# Patient Record
Sex: Female | Born: 1970 | Race: White | Hispanic: No | Marital: Married | Smoking: Never smoker
Health system: Southern US, Community
[De-identification: ages and names within clinical notes are randomized; demographics above are authoritative.]

## PROBLEM LIST (undated history)

## (undated) DIAGNOSIS — G8929 Other chronic pain: Secondary | ICD-10-CM

## (undated) DIAGNOSIS — R51 Headache: Secondary | ICD-10-CM

## (undated) HISTORY — DX: Other chronic pain: G89.29

## (undated) HISTORY — DX: Headache: R51

---

## 1999-02-11 ENCOUNTER — Other Ambulatory Visit: Admission: RE | Admit: 1999-02-11 | Discharge: 1999-02-11 | Payer: Self-pay | Admitting: Obstetrics and Gynecology

## 2002-07-20 ENCOUNTER — Other Ambulatory Visit: Admission: RE | Admit: 2002-07-20 | Discharge: 2002-07-20 | Payer: Self-pay | Admitting: Obstetrics and Gynecology

## 2003-02-21 ENCOUNTER — Ambulatory Visit (HOSPITAL_COMMUNITY): Admission: RE | Admit: 2003-02-21 | Discharge: 2003-02-21 | Payer: Self-pay | Admitting: Obstetrics and Gynecology

## 2003-03-28 ENCOUNTER — Inpatient Hospital Stay (HOSPITAL_COMMUNITY): Admission: AD | Admit: 2003-03-28 | Discharge: 2003-03-30 | Payer: Self-pay | Admitting: Obstetrics and Gynecology

## 2003-03-28 ENCOUNTER — Encounter: Payer: Self-pay | Admitting: Obstetrics and Gynecology

## 2003-05-15 ENCOUNTER — Inpatient Hospital Stay (HOSPITAL_COMMUNITY): Admission: AD | Admit: 2003-05-15 | Discharge: 2003-05-17 | Payer: Self-pay | Admitting: Obstetrics & Gynecology

## 2003-12-25 ENCOUNTER — Other Ambulatory Visit: Admission: RE | Admit: 2003-12-25 | Discharge: 2003-12-25 | Payer: Self-pay | Admitting: Obstetrics and Gynecology

## 2005-04-07 ENCOUNTER — Ambulatory Visit: Payer: Self-pay | Admitting: Family Medicine

## 2005-06-15 ENCOUNTER — Other Ambulatory Visit: Admission: RE | Admit: 2005-06-15 | Discharge: 2005-06-15 | Payer: Self-pay | Admitting: Obstetrics and Gynecology

## 2006-09-28 ENCOUNTER — Ambulatory Visit (HOSPITAL_COMMUNITY): Admission: RE | Admit: 2006-09-28 | Discharge: 2006-09-28 | Payer: Self-pay | Admitting: Obstetrics and Gynecology

## 2006-12-20 ENCOUNTER — Inpatient Hospital Stay (HOSPITAL_COMMUNITY): Admission: AD | Admit: 2006-12-20 | Discharge: 2006-12-22 | Payer: Self-pay | Admitting: Obstetrics and Gynecology

## 2011-08-25 ENCOUNTER — Other Ambulatory Visit: Payer: Self-pay | Admitting: Obstetrics and Gynecology

## 2011-08-25 DIAGNOSIS — N63 Unspecified lump in unspecified breast: Secondary | ICD-10-CM

## 2011-09-04 ENCOUNTER — Other Ambulatory Visit: Payer: Self-pay

## 2011-09-10 ENCOUNTER — Ambulatory Visit
Admission: RE | Admit: 2011-09-10 | Discharge: 2011-09-10 | Disposition: A | Discharge status: Home / Self Care | Payer: BC Managed Care – PPO | Source: Ambulatory Visit | Attending: Obstetrics and Gynecology | Admitting: Obstetrics and Gynecology

## 2011-09-10 DIAGNOSIS — N63 Unspecified lump in unspecified breast: Secondary | ICD-10-CM

## 2016-07-15 LAB — HM PAP SMEAR

## 2016-07-21 ENCOUNTER — Other Ambulatory Visit: Payer: Self-pay | Admitting: Obstetrics and Gynecology

## 2016-07-21 DIAGNOSIS — R928 Other abnormal and inconclusive findings on diagnostic imaging of breast: Secondary | ICD-10-CM

## 2016-07-23 ENCOUNTER — Other Ambulatory Visit: Payer: Self-pay

## 2016-07-29 ENCOUNTER — Ambulatory Visit
Admission: RE | Admit: 2016-07-29 | Discharge: 2016-07-29 | Disposition: A | Discharge status: Home / Self Care | Payer: Managed Care, Other (non HMO) | Source: Ambulatory Visit | Attending: Obstetrics and Gynecology | Admitting: Obstetrics and Gynecology

## 2016-07-29 DIAGNOSIS — R928 Other abnormal and inconclusive findings on diagnostic imaging of breast: Secondary | ICD-10-CM

## 2016-07-29 LAB — HM MAMMOGRAPHY

## 2017-09-23 LAB — HEPATIC FUNCTION PANEL
ALT: 8 (ref 7–35)
AST: 11 — AB (ref 13–35)
Alkaline Phosphatase: 60 (ref 25–125)
Bilirubin, Total: 0.3

## 2017-09-23 LAB — CBC AND DIFFERENTIAL
HCT: 42 (ref 36–46)
Hemoglobin: 13.4 (ref 12.0–16.0)
NEUTROS ABS: 6
Platelets: 271 (ref 150–399)
WBC: 8.2

## 2017-09-23 LAB — BASIC METABOLIC PANEL
BUN: 13 (ref 4–21)
CREATININE: 0.6 (ref 0.5–1.1)
Glucose: 84
POTASSIUM: 4.1 (ref 3.4–5.3)
Sodium: 138 (ref 137–147)

## 2017-09-23 LAB — TSH: TSH: 2.28 (ref 0.41–5.90)

## 2017-09-27 ENCOUNTER — Encounter: Payer: Self-pay | Admitting: Family Medicine

## 2017-09-27 ENCOUNTER — Ambulatory Visit (INDEPENDENT_AMBULATORY_CARE_PROVIDER_SITE_OTHER): Payer: Managed Care, Other (non HMO) | Admitting: Family Medicine

## 2017-09-27 ENCOUNTER — Ambulatory Visit: Payer: Self-pay

## 2017-09-27 ENCOUNTER — Ambulatory Visit (INDEPENDENT_AMBULATORY_CARE_PROVIDER_SITE_OTHER): Payer: Managed Care, Other (non HMO)

## 2017-09-27 VITALS — BP 120/88 | HR 80 | Ht 65.0 in | Wt 162.4 lb

## 2017-09-27 DIAGNOSIS — M79644 Pain in right finger(s): Secondary | ICD-10-CM | POA: Diagnosis not present

## 2017-09-27 DIAGNOSIS — M1811 Unilateral primary osteoarthritis of first carpometacarpal joint, right hand: Secondary | ICD-10-CM | POA: Diagnosis not present

## 2017-09-27 NOTE — Patient Instructions (Signed)

## 2017-09-27 NOTE — Assessment & Plan Note (Signed)
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  Intra-articular injection performed today that provided significant improvement.  I will plan to check in with her in 2 weeks and see how she is progressing at that time.  We will plan to discuss other muscular skeletal complaints at that time including bilateral knees.  May need plain film x-rays at that time of the knees.  Consider further workup for hypermobility syndrome.

## 2017-09-27 NOTE — Procedures (Signed)
PROCEDURE NOTE -  ULTRASOUND GUIDEDINJECTION: Right thumb CMC injection Images were obtained and interpreted by myself, Gaspar BiddingMichael Rigby, DO  Images have been saved and stored to PACS system. Images obtained on: GE S7 Ultrasound machine  ULTRASOUND FINDINGS: Fragmentation of the [facility] consistent with OA with some soft tissue swelling.  Small joint effusion.  DESCRIPTION OF PROCEDURE:  The patient's clinical condition is marked by substantial pain and/or significant functional disability. Other conservative therapy has not provided relief, is contraindicated, or not appropriate. There is a reasonable likelihood that injection will significantly improve the patient's pain and/or functional impairment. After discussing the risks, benefits and expected outcomes of the injection and all questions were reviewed and answered, the patient wished to undergo the above named procedure. Verbal consent was obtained. The ultrasound was used to identify the target structure and adjacent neurovascular structures. The skin was then prepped in sterile fashion and the target structure was injected under direct visualization using sterile technique as below: PREP: Alcohol, Ethel Chloride APPROACH: Direct inplane, single injection, 25g 1.5" needle INJECTATE: 0.5cc 1% lidocaine, 0.5cc 0.5% marcaine, 0.5cc 40mg  DepoMedrol ASPIRATE: N/A DRESSING: Band-Aid  Post procedural instructions including recommending icing and warning signs for infection were reviewed. This procedure was well tolerated and there were no complications.   IMPRESSION: Succesful US Guided Injection

## 2017-09-27 NOTE — Progress Notes (Signed)
Subjective:  Stephanie Phelps is a 46 y.o. female who presents today with a chief complaint of right thumb pain.   HPI:  Right Thumb Pain, Acute Issue Started this morning. Stable over that time. Patient shook a piece of hair off her hand and felt a pop. No trauma or collision. Also with some swelling to the area. Took 2 ibuprofen which did not significantly help.  She has also used ice which has helped reduce some of the swelling.  She dislocated her knee in the past.  She also has some arthritis in her left thumb.  ROS: Per HPI, otherwise a 14 point review of systems was performed and was negative  PMH:  The following were reviewed and entered/updated in epic: Past Medical History:  Diagnosis Date  . Chronic headaches    Patient Active Problem List   Diagnosis Date Noted  . Primary osteoarthritis of first carpometacarpal joint of right hand 09/27/2017   History reviewed. No pertinent surgical history.  Family History  Problem Relation Age of Onset  . Allergies Mother   . Fibroids Mother   . Hearing loss Father   . Hypertension Father   . Diabetes Maternal Grandmother   . Birth defects Paternal Grandmother        colon cancer  . Hearing loss Paternal Grandfather   . Stroke Paternal Grandfather     Medications- reviewed and updated Current Outpatient Prescriptions  Medication Sig Dispense Refill  . Ascorbic Acid CRYS Take by mouth.    Marland Kitchen ibuprofen (ADVIL,MOTRIN) 200 MG tablet Take 200 mg by mouth every 6 (six) hours as needed.    Marland Kitchen levonorgestrel (MIRENA) 20 MCG/24HR IUD by Intrauterine route.    . Multiple Vitamin (MULTIVITAMIN) tablet Take 1 tablet by mouth daily.     No current facility-administered medications for this visit.     Allergies-reviewed and updated No Known Allergies  Social History   Social History  . Marital status: Married    Spouse name: N/A  . Number of children: N/A  . Years of education: N/A   Social History Main Topics  . Smoking  status: Never Smoker  . Smokeless tobacco: Never Used  . Alcohol use Yes  . Drug use: No  . Sexual activity: Yes   Other Topics Concern  . None   Social History Narrative  . None     Objective:  Physical Exam: BP 120/88   Pulse 80   Ht 5\' 5"  (1.651 m)   Wt 162 lb 6.4 oz (73.7 kg)   LMP 09/13/2017   SpO2 99%   BMI 27.02 kg/m   Gen: NAD, resting comfortably CV: RRR with no murmurs appreciated Pulm: NWOB, CTAB with no crackles, wheezes, or rhonchi GI: Normal bowel sounds present. Soft, Nontender, Nondistended. MSK:  -Right hand: No gross deformities. Tender over CMC joint.  Right thumb with limited range of motion at [facility] joint. She is able to make a fist and is able to oppose. No ecchymoses.  Cap refill normal.  Neurovascularly intact distally. Skin: Warm, dry Neuro: Grossly normal, moves all extremities Psych: Normal affect and thought content  Imaging Dg Hand Complete Right  Result Date: 09/27/2017 CLINICAL DATA:  Right hand pain, no known injury, initial encounter EXAM: RIGHT HAND - COMPLETE 3+ VIEW COMPARISON:  None. FINDINGS: No acute fracture or dislocation is noted. Mild subluxation at the first [facility] joint is noted. Cystic degeneration in the scaphoid is seen. No other bony abnormality is noted. IMPRESSION: Mild degenerative  change without acute abnormality. Electronically Signed   By: Alcide CleverMark  Lukens M.D.   On: 09/27/2017 15:14   [place] Limited Joint Space Structures Up Right(no Linked Charges)  Result Date: 09/27/2017 Andrena MewsRigby, Michael D, DO     09/27/2017  2:11 PM PROCEDURE NOTE -  ULTRASOUND GUIDEDINJECTION: Right thumb CMC injection Images were obtained and interpreted by myself, Gaspar BiddingMichael Rigby, DO Images have been saved and stored to PACS system. Images obtained on: GE S7 Ultrasound machine  ULTRASOUND FINDINGS: Fragmentation of the [facility] consistent with OA with some soft tissue swelling.  Small joint effusion. DESCRIPTION OF PROCEDURE: The patient's clinical condition  is marked by substantial pain and/or significant functional disability. Other conservative therapy has not provided relief, is contraindicated, or not appropriate. There is a reasonable likelihood that injection will significantly improve the patient's pain and/or functional impairment. After discussing the risks, benefits and expected outcomes of the injection and all questions were reviewed and answered, the patient wished to undergo the above named procedure. Verbal consent was obtained. The ultrasound was used to identify the target structure and adjacent neurovascular structures. The skin was then prepped in sterile fashion and the target structure was injected under direct visualization using sterile technique as below: PREP: Alcohol, Ethel Chloride APPROACH: Direct inplane, single injection, 25g 1.5" needle INJECTATE: 0.5cc 1% lidocaine, 0.5cc 0.5% marcaine, 0.5cc 40mg  DepoMedrol ASPIRATE: N/A DRESSING: Band-Aid Post procedural instructions including recommending icing and warning signs for infection were reviewed. This procedure was well tolerated and there were no complications.  IMPRESSION: Succesful US Guided Injection     Assessment/Plan:  Right Thumb Pain Likely secondary to mild subluxation and degenerative changes.  She does have a history of knee dislocation and may have hypermobility syndrome.  She also has likely underlying CMC osteoarthritis which could have contributed to her subluxation.  No signs of fracture or complete dislocation on her plain film.  Discussed with sports medicine physician, Dr. Arlyss Queenigby-he will be performing ultrasound-guided procedure.  Please see his separate procedure documentation.  Katina Degreealeb M. Jimmey RalphParker, MD 09/27/2017 4:00 PM

## 2017-09-28 ENCOUNTER — Telehealth: Payer: Self-pay | Admitting: Sports Medicine

## 2017-09-28 NOTE — Telephone Encounter (Signed)
Patient called in reference to cortisone shot received 09/27/17. Patient stated 2 hours after shot, pain started coming back. Patient would like to speak with a nurse about this and see if this is normal considering this was her first cortisone shot. Please call patient and advise. Ok to leave message.

## 2017-09-29 NOTE — Telephone Encounter (Signed)
Pt returned call and was advised that numbing medicine was injected along with the steroid so once that wears off the pain can return, typically within the first 24-48 hours. The pain should gradually continue to improve over the next 2 weeks. Pt verbalized understanding.

## 2017-09-29 NOTE — Telephone Encounter (Signed)
error 

## 2017-09-29 NOTE — Telephone Encounter (Signed)
Called pt and left VM to call the office.  

## 2017-10-07 ENCOUNTER — Telehealth: Payer: Self-pay

## 2017-10-07 NOTE — Telephone Encounter (Signed)
[facility]

## 2017-10-11 ENCOUNTER — Encounter: Payer: Self-pay | Admitting: Sports Medicine

## 2017-10-11 ENCOUNTER — Ambulatory Visit: Payer: Managed Care, Other (non HMO) | Admitting: Sports Medicine

## 2017-10-11 VITALS — BP 130/84 | HR 82 | Ht 65.0 in | Wt 161.4 lb

## 2017-10-11 DIAGNOSIS — M222X2 Patellofemoral disorders, left knee: Secondary | ICD-10-CM | POA: Diagnosis not present

## 2017-10-11 DIAGNOSIS — I8393 Asymptomatic varicose veins of bilateral lower extremities: Secondary | ICD-10-CM | POA: Insufficient documentation

## 2017-10-11 DIAGNOSIS — M222X1 Patellofemoral disorders, right knee: Secondary | ICD-10-CM

## 2017-10-11 DIAGNOSIS — M1811 Unilateral primary osteoarthritis of first carpometacarpal joint, right hand: Secondary | ICD-10-CM | POA: Diagnosis not present

## 2017-10-11 MED ORDER — DICLOFENAC SODIUM 2 % TD SOLN: 1.0000 "application " | Freq: Two times a day (BID) | TRANSDERMAL | 2 refills | Status: AC

## 2017-10-11 MED ORDER — DICLOFENAC SODIUM 2 % TD SOLN: 1.0000 "application " | Freq: Two times a day (BID) | TRANSDERMAL | 0 refills | Status: AC

## 2017-10-11 NOTE — Assessment & Plan Note (Signed)
2 out of 10 discomfort at this time.  We will have her continue with topical Pennsaid for the next week then transition to as needed.  Suspect this will only continue to improve.  Can consider repeat injections.  She has mild to moderate OA.  Suspect acute subluxation with likely irritation of a bone spur that resulted in her prior symptoms.  If recurrent can consider further advanced imaging.

## 2017-10-11 NOTE — Progress Notes (Signed)
OFFICE VISIT NOTE Stephanie FellsMichael D. Delorise Shinerigby, Phelps  [facility] [phone]  Stephanie Phelps - 46 y.o. female MRN [id number]  Date of birth: 05/28/1971  Visit Date: 10/11/2017  PCP: Stephanie Phelps, Stephanie M, MD   Referred by: Stephanie Phelps, Stephanie M, MD  Stephanie Phelps, CMA acting as scribe for Dr. Berline Phelps.  SUBJECTIVE:   Chief Complaint  Patient presents with  . Follow-up    RT hand pain   HPI: As below and per problem based documentation when appropriate.  Stephanie Phelps is presenting today in follow-up of RT hand pain. She was seen by Dr. Jimmey RalphParker 09/27/2017 and received Depo-Medrol inj.   Pt reports improvement in RT hand pain since she was last seen. She continues soreness in the joint while using the hand but its not a severe as it was prior to the injection. She has noticed that it is easier to write and work. She still has a hard time trying to open things like a water bottle. She has noticed a little swelling around the joint. She has been on the road training recently so she hasn't been doing as much physical work while at work.   Chronic bilateral knee pain with crepitation that is mild in nature.  Some grinding especially while going up and down steps.  No locking or giving way.  Ibuprofen does seem to be beneficial if she takes it when she is in pain but currently in 0 out of 10 discomfort.    Review of Systems  Constitutional: Negative for chills, fever and malaise/fatigue.  HENT: Positive for congestion and sinus pain.   Respiratory: Negative for shortness of breath and wheezing.   Cardiovascular: Negative for chest pain and palpitations.  Musculoskeletal: Positive for joint pain.  Neurological: Positive for headaches. Negative for dizziness, tingling and weakness.  Endo/Heme/Allergies: Positive for environmental allergies.    Otherwise per HPI.  HISTORY & PERTINENT PRIOR DATA:  No specialty comments available. She reports that  has never smoked. she has  never used smokeless tobacco. No results for input(s): HGBA1C, LABURIC, CREATINE in the last 8760 hours.  Invalid input(s): CR Allergies reviewed per EMR Prior to Admission medications   Medication Sig Start Date End Date Taking? Authorizing Provider  Ascorbic Acid CRYS Take by mouth.   Yes [provider]  Cholecalciferol (VITAMIN D3) 1000 units CHEW Chew by mouth.   Yes [provider]  ibuprofen (ADVIL,MOTRIN) 200 MG tablet Take 200 mg by mouth every 6 (six) hours as needed.   Yes [provider]  levonorgestrel (MIRENA) 20 MCG/24HR IUD by Intrauterine route.   Yes [provider]  Multiple Vitamin (MULTIVITAMIN) tablet Take 1 tablet by mouth daily.   Yes [provider]  Diclofenac Sodium (PENNSAID) 2 % SOLN Place 1 application 2 (two) times daily onto the skin. 10/11/17   Stephanie Mewsigby, Stephanie Phelps  Diclofenac Sodium (PENNSAID) 2 % SOLN Place 1 application 2 (two) times daily for 1 day onto the skin. 10/11/17 10/12/17  Stephanie Mewsigby, Stephanie Phelps   Patient Active Problem List   Diagnosis Date Noted  . Varicose veins of both lower extremities 10/11/2017  . Patellofemoral pain syndrome of both knees 10/11/2017  . Primary osteoarthritis of first carpometacarpal joint of right hand 09/27/2017   Past Medical History:  Diagnosis Date  . Chronic headaches    Family History  Problem Relation Age of Onset  . Allergies Mother   . Fibroids Mother   . Hearing  loss Father   . Hypertension Father   . Diabetes Maternal Grandmother   . Birth defects Paternal Grandmother        colon cancer  . Hearing loss Paternal Grandfather   . Stroke Paternal Grandfather    No past surgical history on file. Social History   Occupational History  . Not on file  Tobacco Use  . Smoking status: Never Smoker  . Smokeless tobacco: Never Used  Substance and Sexual Activity  . Alcohol use: Yes  . Drug use: No  . Sexual activity: Yes    OBJECTIVE:  VS:  HT:5\' 5"   (165.1 cm)   WT:161 lb 6.4 oz (73.2 kg)  BMI:26.86    BP:130/84  HR:82bpm  TEMP: ( )  RESP:98 % EXAM: Findings:  Right hand overall well aligned.  No significant deformity.  She has a small amount of pain over the [facility] with direct compression however no pain with axial loading or circumduction.  No crepitation appreciated.  Grip strength is intact.  Thumb abduction and opposition is intact to the index finger as well as to the fifth finger.  Bilateral knees are overall well aligned.  She has no significant mechanical symptoms with [place].  No effusion.  VMO definition is poorly defined.  She has a slight valgus deformity but this is mild. + Patellar Grind    RADIOLOGY: DG Hand Complete Right CLINICAL DATA:  Right hand pain, no known injury, initial encounter  EXAM: RIGHT HAND - COMPLETE 3+ VIEW  COMPARISON:  None.  FINDINGS: No acute fracture or dislocation is noted. Mild subluxation at the first [facility] is noted. Cystic degeneration in the scaphoid is seen. No other bony abnormality is noted.  IMPRESSION: Mild degenerative change without acute abnormality.  Electronically Signed   By: Alcide CleverMark  Lukens Phelps.D.   On: 09/27/2017 15:14 US LIMITED JOINT SPACE STRUCTURES UP RIGHT(NO LINKED CHARGES) Stephanie Phelps, Stephanie Phelps     09/27/2017  2:11 PM PROCEDURE NOTE -  ULTRASOUND GUIDEDINJECTION: Right thumb CMC  injection Images were obtained and interpreted by myself, Stephanie BiddingMichael Rigby, Phelps   Images have been saved and stored to PACS system. Images obtained on: GE S7 Ultrasound machine  ULTRASOUND FINDINGS: Fragmentation of the [facility] joint consistent  with OA with some soft tissue swelling.  Small joint effusion.  DESCRIPTION OF PROCEDURE:  The patient's clinical condition is marked by substantial pain  and/or significant functional disability. Other conservative  therapy has not provided relief, is contraindicated, or not  appropriate. There is a reasonable likelihood that  injection will  significantly improve the patient's pain and/or functional  impairment. After discussing the risks, benefits and expected  outcomes of the injection and all questions were reviewed and  answered, the patient wished to undergo the above named  procedure. Verbal consent was obtained. The ultrasound was used  to identify the target structure and adjacent neurovascular  structures. The skin was then prepped in sterile fashion and the  target structure was injected under direct visualization using  sterile technique as below: PREP: Alcohol, Ethel Chloride APPROACH: Direct inplane, single injection, 25g 1.5" needle INJECTATE: 0.5cc 1% lidocaine, 0.5cc 0.5% marcaine, 0.5cc 40mg   DepoMedrol ASPIRATE: N/A DRESSING: Band-Aid  Post procedural instructions including recommending icing and  warning signs for infection were reviewed. This procedure was  well tolerated and there were no complications.   IMPRESSION: Succesful US Guided Injection    ASSESSMENT & PLAN:     ICD-10-CM   1. Primary osteoarthritis of  first carpometacarpal joint of right hand M18.11   2. 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  No significant mechanical symptoms.  Therapeutic exercises per procedure note  Primary osteoarthritis of first carpometacarpal joint of right hand 2 out of 10 discomfort at this time.  We will have her continue with topical Pennsaid for the next week then transition to as needed.  Suspect this will only continue to improve.  Can consider repeat injections.  She has mild to moderate OA.  Suspect acute subluxation with likely irritation of a bone spur that resulted in her prior symptoms.  If recurrent can consider further advanced imaging.  PROCEDURE NOTE: THERAPEUTIC EXERCISES  ([id number]) 15 minutes spent for Therapeutic exercises as below and as referenced in the AVS. This included exercises focusing on stretching, strengthening, with significant focus on eccentric aspects.  Proper technique shown and discussed handout in great detail with ATC. All questions were discussed and answered.   Long term goals include an improvement in range of motion, strength, endurance as well as avoiding reinjury. Frequency of visits is one time as determined during today's  office visit. Frequency of exercises to be performed is as per handout.  EXERCISES REVIEWED:  VMO and Hip Abduction  ================================================================= Patient Instructions  Please perform the exercise program that we have prepared for you and gone over in detail on a daily basis.  In addition to the handout you were provided you can access your program through: [email or web address]   Your unique program code is:  [email or web address]  ================================================================= No future appointments.  Follow-up: Return if symptoms worsen or fail to improve.   CMA/ATC served as Neurosurgeon during this visit. History, Physical, and Plan performed by medical provider. Documentation and orders reviewed and attested to.      Stephanie Bidding, Phelps    [facility] Sports Medicine Physician

## 2017-10-11 NOTE — Procedures (Signed)

## 2017-10-11 NOTE — Assessment & Plan Note (Signed)
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  No significant mechanical symptoms.  Therapeutic exercises per procedure note

## 2017-10-11 NOTE — Patient Instructions (Signed)
Please perform the exercise program that we have prepared for you and gone over in detail on a daily basis.  In addition to the handout you were provided you can access your program through: [email or web address]   Your unique program code is:  [email or web address]

## 2017-12-13 ENCOUNTER — Encounter: Payer: Self-pay | Admitting: Physical Therapy

## 2018-04-21 ENCOUNTER — Telehealth: Payer: Self-pay | Admitting: Family Medicine

## 2018-04-21 NOTE — Telephone Encounter (Signed)
Called [employer], they will fax over another copy of the complete form.

## 2018-04-21 NOTE — Telephone Encounter (Signed)
Copied from CRM [id number]. Topic: Inquiry >> Apr 21, 2018 12:44 PM Maia Petties wrote: Reason for CRM: Stephanie Phelps only received partial fax with the "attending physician statement". Please refax to her at [phone]

## 2018-04-22 NOTE — Telephone Encounter (Signed)
Only one form was received from pt. Additional forms have still not been received. Called pt, unable to leave VM.

## 2018-04-26 NOTE — Telephone Encounter (Signed)
Spoke with patient and advise that forms has been faxed and that I had been contacted by [employer] about an additional form. She will get the form to [employer] and contact the office if any additional information is needed.

## 2018-05-11 NOTE — Telephone Encounter (Signed)
See note.   Copied from CRM [id number]. Topic: General - Other >> May 11, 2018  1:07 PM Cipriano BunkerLambe, Annette S wrote: Reason for CRM:    Pt is needing to come by and pick up copy of her faxed for Accident sheet under Media .  She is coming by this afternoon to pick up at 4:30

## 2018-05-12 NOTE — Telephone Encounter (Signed)
Called and left VM for pt to call the office. Form is ready for pick up.

## 2018-05-12 NOTE — Telephone Encounter (Signed)
Forwarding

## 2018-05-13 NOTE — Telephone Encounter (Signed)
Per Ellie, pt called yesterday and was advised that form is ready for pick up.

## 2019-09-17 NOTE — Progress Notes (Signed)
[facility] [place], [place] [address] Phone: [phone] Subjective:   I [name] am serving as a Education administrator for Dr. Hulan Saas.    CC: Left shoulder pain, new problem  ASN:[id number]  Stephanie Phelps is a 48 y.o. female coming in with complaint of left shoulder pain. Doesn't remember a specific injury that caused pain. Tingling radiates down the arm into the hand.   Onset- chronic (4 weeks)  Location- joint (superior)  Character- achy, pull, extension, dull Aggravating factors- ADLs Reliving factors-   Therapies tried- Ibuprofen  Severity-  4-6/10 at its worse      Past Medical History:  Diagnosis Date  . Chronic headaches    No past surgical history on file. Social History   Socioeconomic History  . Marital status: Married    Spouse name: Not on file  . Number of children: Not on file  . Years of education: Not on file  . Highest education level: Not on file  Occupational History  . Not on file  Social Needs  . Financial resource strain: Not on file  . Food insecurity    Worry: Not on file    Inability: Not on file  . Transportation needs    Medical: Not on file    Non-medical: Not on file  Tobacco Use  . Smoking status: Never Smoker  . Smokeless tobacco: Never Used  Substance and Sexual Activity  . Alcohol use: Yes  . Drug use: No  . Sexual activity: Yes  Lifestyle  . Physical activity    Days per week: Not on file    Minutes per session: Not on file  . Stress: Not on file  Relationships  . Social Herbalist on phone: Not on file    Gets together: Not on file    Attends religious service: Not on file    Active member of club or organization: Not on file    Attends meetings of clubs or organizations: Not on file    Relationship status: Not on file  Other Topics Concern  . Not on file  Social History Narrative  . Not on file   No Known Allergies Family History  Problem Relation Age of  Onset  . Allergies Mother   . Fibroids Mother   . Hearing loss Father   . Hypertension Father   . Diabetes Maternal Grandmother   . Birth defects Paternal Grandmother        colon cancer  . Hearing loss Paternal Grandfather   . Stroke Paternal Grandfather     Current Outpatient Medications (Endocrine & Metabolic):  .  levonorgestrel (MIRENA) 20 MCG/24HR IUD, by Intrauterine route.    Current Outpatient Medications (Analgesics):  .  ibuprofen (ADVIL,MOTRIN) 200 MG tablet, Take 200 mg by mouth every 6 (six) hours as needed. .  meloxicam (MOBIC) 15 MG tablet, Take 1 tablet (15 mg total) by mouth daily.   Current Outpatient Medications (Other):  Marland Kitchen  Ascorbic Acid CRYS, Take by mouth. .  Cholecalciferol (VITAMIN D3) 1000 units CHEW, Chew by mouth. .  Diclofenac Sodium (PENNSAID) 2 % SOLN, Place 1 application 2 (two) times daily onto the skin. .  Multiple Vitamin (MULTIVITAMIN) tablet, Take 1 tablet by mouth daily. Marland Kitchen  gabapentin (NEURONTIN) 100 MG capsule, Take 2 capsules (200 mg total) by mouth at bedtime. .  Vitamin D, Ergocalciferol, (DRISDOL) 1.25 MG (50000 UT) CAPS capsule, Take 1 capsule (50,000 Units total) by  mouth every 7 (seven) days.    Past medical history, social, surgical and family history all reviewed in electronic medical record.  No pertanent information unless stated regarding to the chief complaint.   Review of Systems:  No headache, visual changes, nausea, vomiting, diarrhea, constipation, dizziness, abdominal pain, skin rash, fevers, chills, night sweats, weight loss, swollen lymph nodes, body aches, joint swelling, chest pain, shortness of breath, mood changes.  Positive muscle aches  Objective  Blood pressure (!) 146/84, pulse 78, height 5\' 5"  (1.651 m), weight 174 lb (78.9 kg), SpO2 98 %.    General: No apparent distress alert and oriented x3 mood and affect normal, dressed appropriately.  HEENT: Pupils equal, extraocular movements intact  Respiratory:  Patient's speak in full sentences and does not appear short of breath  Cardiovascular: No lower extremity edema, non tender, no erythema  Skin: Warm dry intact with no signs of infection or rash on extremities or on axial skeleton.  Abdomen: Soft nontender  Neuro: Cranial nerves II through XII are intact, neurovascularly intact in all extremities with 2+ DTRs and 2+ pulses.  Lymph: No lymphadenopathy of posterior or anterior cervical chain or axillae bilaterally.  Gait normal with good balance and coordination.  MSK:  Non tender with full range of motion and good stability and symmetric strength and tone of  elbows, wrist, hip, knee and ankles bilaterally.  Shoulder: left Inspection reveals no abnormalities, atrophy or asymmetry. Palpation is normal with no tenderness over AC joint or bicipital groove. ROM is decreased in all planes of 5 to 10 degrees. Rotator cuff strength normal throughout. signs of impingement with positive Neer and Hawkin's tests, but negative empty can sign. Speeds and Yergason's tests normal. labral pathology noted with negative Obrien's, negative clunk and good stability. Normal scapular function observed. No painful arc and no drop arm sign. No apprehension sign  MSK performed of: left This study was ordered, performed, and interpreted by [place] D.O.  Shoulder:   Supraspinatus:  Appears normal on long and transverse views, Bursal bulge seen with shoulder abduction on impingement view.  Thickening of anterior capsule noted Infraspinatus:  Appears normal on long and transverse views. Significant increase in Doppler flow Subscapularis:  Appears normal on long and transverse views. Positive bursa Teres Minor:  Appears normal on long and transverse views. AC joint: Moderate arthritic changes Glenohumeral Joint:  Appears normal without effusion. Glenoid Labrum:  Intact without visualized tears. Biceps Tendon:  Appears normal on long and transverse views, no  fraying of tendon, tendon located in intertubercular groove, no subluxation with shoulder internal o r external rotation.  Impression: Subacromial bursitis with likely early frozen shoulder  Procedure: Real-time Ultrasound Guided Injection of left glenohumeral joint Device: GE Logiq E  Ultrasound guided injection is preferred based studies that show increased duration, increased effect, greater accuracy, decreased procedural pain, increased response rate with ultrasound guided versus blind injection.  Verbal informed consent obtained.  Time-out conducted.  Noted no overlying erythema, induration, or other signs of local infection.  Skin prepped in a sterile fashion.  Local anesthesia: Topical Ethyl chloride.  With sterile technique and under real time ultrasound guidance:  Joint visualized.  23g 1  inch needle inserted posterior approach. Pictures taken for needle placement. Patient did have injection of 2 cc of 1% lidocaine, 2 cc of 0.5% Marcaine, and 1.0 cc of Kenalog 40 mg/dL. Completed without difficulty  Pain immediately resolved suggesting accurate placement of the medication.  Advised to call if fevers/chills,  erythema, induration, drainage, or persistent bleeding.  Images permanently stored and available for review in the ultrasound unit.  Impression: Technically successful ultrasound guided injection.  /[id number]; 15 additional minutes spent for Therapeutic exercises as stated in above notes.  This included exercises focusing on stretching, strengthening, with significant focus on eccentric aspects.   Long term goals include an improvement in range of motion, strength, endurance as well as avoiding reinjury. Patient's frequency would include in 1-2 times a day, 3-5 times a week for a duration of 6-12 weeks. Shoulder Exercises that included:  Basic scapular stabilization to include adduction and depression of scapula Scaption, focusing on proper movement and good control Internal and  External rotation utilizing a theraband, with elbow tucked at side entire time Rows with theraband which was given today  Proper technique shown and discussed handout in great detail with ATC.  All questions were discussed and answered.      Impression and Recommendations:     This case required medical decision making of moderate complexity. The above documentation has been reviewed and is accurate and complete Stephanie SaaZachary M Song Myre, DO       Note: This dictation was prepared with Dragon dictation along with smaller phrase technology. Any transcriptional errors that result from this process are unintentional.

## 2019-09-18 ENCOUNTER — Ambulatory Visit (INDEPENDENT_AMBULATORY_CARE_PROVIDER_SITE_OTHER): Payer: Managed Care, Other (non HMO) | Admitting: Family Medicine

## 2019-09-18 ENCOUNTER — Other Ambulatory Visit: Payer: Self-pay

## 2019-09-18 ENCOUNTER — Ambulatory Visit: Payer: Self-pay

## 2019-09-18 ENCOUNTER — Encounter: Payer: Self-pay | Admitting: Family Medicine

## 2019-09-18 VITALS — BP 146/84 | HR 78 | Ht 65.0 in | Wt 174.0 lb

## 2019-09-18 DIAGNOSIS — M25512 Pain in left shoulder: Secondary | ICD-10-CM

## 2019-09-18 DIAGNOSIS — M7502 Adhesive capsulitis of left shoulder: Secondary | ICD-10-CM | POA: Diagnosis not present

## 2019-09-18 DIAGNOSIS — G8929 Other chronic pain: Secondary | ICD-10-CM | POA: Diagnosis not present

## 2019-09-18 DIAGNOSIS — M7500 Adhesive capsulitis of unspecified shoulder: Secondary | ICD-10-CM | POA: Insufficient documentation

## 2019-09-18 MED ORDER — GABAPENTIN 100 MG PO CAPS: 200.0000 mg | ORAL_CAPSULE | Freq: Every day | ORAL | 0 refills | Status: AC

## 2019-09-18 MED ORDER — MELOXICAM 15 MG PO TABS: 15.0000 mg | ORAL_TABLET | Freq: Every day | ORAL | 0 refills | Status: DC

## 2019-09-18 MED ORDER — VITAMIN D (ERGOCALCIFEROL) 1.25 MG (50000 UNIT) PO CAPS: 50000.0000 [IU] | ORAL_CAPSULE | ORAL | 0 refills | Status: AC

## 2019-09-18 NOTE — Patient Instructions (Addendum)
Once Weekly Vit D Meloxicam Exercises 3x a week Ice 20 min 2x a day Gabapentin See me again in 5-6 weeks

## 2019-09-18 NOTE — Assessment & Plan Note (Signed)
Patient given injection, tolerated the procedure well, discussed with her to see rheumatologist.  Patient is to increase activity slowly.  Discussed icing regimen.  Patient will vitamin D, meloxicam and gabapentin given.  Follow-up with me again in 6 weeks

## 2019-10-12 ENCOUNTER — Other Ambulatory Visit: Payer: Self-pay | Admitting: Family Medicine

## 2019-10-29 NOTE — Progress Notes (Signed)
[facility] [address] [place] [place], [place] [address] Phone: [phone] Subjective:   Stephanie Phelps, am serving as a scribe for Dr. Antoine Primas.  I'm seeing this patient by the request  of:  Ardith Dark, MD   This visit occurred during the SARS-CoV-2 public health emergency.  Safety protocols were in place, including screening questions prior to the visit, additional usage of staff PPE, and extensive cleaning of exam room while observing appropriate contact time as indicated for disinfecting solutions.     CC: Left shoulder pain  URK:[id number]   09/18/2019 Patient given injection, tolerated the procedure well, discussed with her to see rheumatologist.  Patient is to increase activity slowly.  Discussed icing regimen.  Patient will vitamin D, meloxicam and gabapentin given.  Follow-up with me again in 6 weeks  Update 10/30/2019 Stephanie Phelps is a 48 y.o. female coming in with complaint of left shoulder pain. Injection did help after one week. Feels that she is improving. Is able to put bra on without pain. General soreness with flexion. Has not used gabapentin but did try meloxicam and vitamin D.    Shoulder was injected September 18, 2019  Past Medical History:  Diagnosis Date  . Chronic headaches    No past surgical history on file. Social History   Socioeconomic History  . Marital status: Married    Spouse name: Not on file  . Number of children: Not on file  . Years of education: Not on file  . Highest education level: Not on file  Occupational History  . Not on file  Social Needs  . Financial resource strain: Not on file  . Food insecurity    Worry: Not on file    Inability: Not on file  . Transportation needs    Medical: Not on file    Non-medical: Not on file  Tobacco Use  . Smoking status: Never Smoker  . Smokeless tobacco: Never Used  Substance and Sexual Activity  . Alcohol use: Yes  . Drug use: No  . Sexual activity: Yes   Lifestyle  . Physical activity    Days per week: Not on file    Minutes per session: Not on file  . Stress: Not on file  Relationships  . Social Musician on phone: Not on file    Gets together: Not on file    Attends religious service: Not on file    Active member of club or organization: Not on file    Attends meetings of clubs or organizations: Not on file    Relationship status: Not on file  Other Topics Concern  . Not on file  Social History Narrative  . Not on file   No Known Allergies Family History  Problem Relation Age of Onset  . Allergies Mother   . Fibroids Mother   . Hearing loss Father   . Hypertension Father   . Diabetes Maternal Grandmother   . Birth defects Paternal Grandmother        colon cancer  . Hearing loss Paternal Grandfather   . Stroke Paternal Grandfather     Current Outpatient Medications (Endocrine & Metabolic):  .  levonorgestrel (MIRENA) 20 MCG/24HR IUD, by Intrauterine route.    Current Outpatient Medications (Analgesics):  .  ibuprofen (ADVIL,MOTRIN) 200 MG tablet, Take 200 mg by mouth every 6 (six) hours as needed. .  meloxicam (MOBIC) 15 MG tablet, TAKE 1 TABLET BY MOUTH EVERY  DAY   Current Outpatient Medications (Other):  Marland Kitchen  Ascorbic Acid CRYS, Take by mouth. .  Cholecalciferol (VITAMIN D3) 1000 units CHEW, Chew by mouth. .  Diclofenac Sodium (PENNSAID) 2 % SOLN, Place 1 application 2 (two) times daily onto the skin. Marland Kitchen  gabapentin (NEURONTIN) 100 MG capsule, Take 2 capsules (200 mg total) by mouth at bedtime. .  Multiple Vitamin (MULTIVITAMIN) tablet, Take 1 tablet by mouth daily. .  Vitamin D, Ergocalciferol, (DRISDOL) 1.25 MG (50000 UT) CAPS capsule, Take 1 capsule (50,000 Units total) by mouth every 7 (seven) days. .  Vitamin D, Ergocalciferol, (DRISDOL) 1.25 MG (50000 UT) CAPS capsule, Take 1 capsule (50,000 Units total) by mouth every 7 (seven) days.    Past medical history, social, surgical and family history  all reviewed in electronic medical record.  No pertanent information unless stated regarding to the chief complaint.   Review of Systems:  No headache, visual changes, nausea, vomiting, diarrhea, constipation, dizziness, abdominal pain, skin rash, fevers, chills, night sweats, weight loss, swollen lymph nodes, body aches, joint swelling, muscle aches, chest pain, shortness of breath, mood changes.   Objective  Blood pressure 130/70, pulse 75, height 5\' 5"  (1.651 m), weight 174 lb (78.9 kg), SpO2 98 %.   General: No apparent distress alert and oriented x3 mood and affect normal, dressed appropriately.  HEENT: Pupils equal, extraocular movements intact  Respiratory: Patient's speak in full sentences and does not appear short of breath  Cardiovascular: No lower extremity edema, non tender, no erythema  Skin: Warm dry intact with no signs of infection or rash on extremities or on axial skeleton.  Abdomen: Soft nontender  Neuro: Cranial nerves II through XII are intact, neurovascularly intact in all extremities with 2+ DTRs and 2+ pulses.  Lymph: No lymphadenopathy of posterior or anterior cervical chain or axillae bilaterally.  Gait normal with good balance and coordination.  MSK:  Non tender with full range of motion and good stability and symmetric strength and tone of elbows, wrist, hip, knee and ankles bilaterally.  Shoulder: Left Inspection reveals no abnormalities, atrophy or asymmetry. Palpation is normal with no tenderness over AC joint or bicipital groove. ROM is decreased but improvement in forward flexion and with internal range of motion.  With external range of motion of 5 degrees. Rotator cuff strength normal throughout. Mild positive impingement with Hawkins Speeds and Yergason's tests normal. No labral pathology noted with negative Obrien's, negative clunk and good stability. Normal scapular function observed. No painful arc and no drop arm sign. No apprehension sign  Contralateral shoulder unremarkable   Limited musculoskeletal ultrasound was performed and interpreted by Lyndal Pulley   Limited ultrasound shows that patient does have significant decrease in size of the anterior capsule from previous exam.   Impression and Recommendations:     This case required medical decision making of moderate complexity. The above documentation has been reviewed and is accurate and complete Lyndal Pulley, DO       Note: This dictation was prepared with Dragon dictation along with smaller phrase technology. Any transcriptional errors that result from this process are unintentional.

## 2019-10-30 ENCOUNTER — Ambulatory Visit: Payer: Self-pay

## 2019-10-30 ENCOUNTER — Other Ambulatory Visit: Payer: Self-pay

## 2019-10-30 ENCOUNTER — Ambulatory Visit (INDEPENDENT_AMBULATORY_CARE_PROVIDER_SITE_OTHER): Payer: Managed Care, Other (non HMO) | Admitting: Family Medicine

## 2019-10-30 ENCOUNTER — Encounter: Payer: Self-pay | Admitting: Family Medicine

## 2019-10-30 VITALS — BP 130/70 | HR 75 | Ht 65.0 in | Wt 174.0 lb

## 2019-10-30 DIAGNOSIS — M25512 Pain in left shoulder: Secondary | ICD-10-CM | POA: Diagnosis not present

## 2019-10-30 DIAGNOSIS — M7502 Adhesive capsulitis of left shoulder: Secondary | ICD-10-CM

## 2019-10-30 DIAGNOSIS — G8929 Other chronic pain: Secondary | ICD-10-CM | POA: Diagnosis not present

## 2019-10-30 MED ORDER — VITAMIN D (ERGOCALCIFEROL) 1.25 MG (50000 UNIT) PO CAPS: 50000.0000 [IU] | ORAL_CAPSULE | ORAL | 0 refills | Status: DC

## 2019-10-30 NOTE — Assessment & Plan Note (Signed)
Patient is doing significantly better.  Increase in range of motion.  We discussed continuing the same medications including the vitamin D supplementation.  Patient will continue to work on range of motion.  Declined formal physical therapy secondary to coronavirus.  Follow-up with me again in 2 months

## 2019-10-30 NOTE — Patient Instructions (Signed)
Vitamin D refilled See me again in 2 months

## 2019-12-20 ENCOUNTER — Other Ambulatory Visit: Payer: Self-pay | Admitting: Obstetrics and Gynecology

## 2019-12-20 DIAGNOSIS — R928 Other abnormal and inconclusive findings on diagnostic imaging of breast: Secondary | ICD-10-CM

## 2019-12-27 ENCOUNTER — Ambulatory Visit
Admission: RE | Admit: 2019-12-27 | Discharge: 2019-12-27 | Disposition: A | Discharge status: Home / Self Care | Payer: No Typology Code available for payment source | Source: Ambulatory Visit | Attending: Obstetrics and Gynecology | Admitting: Obstetrics and Gynecology

## 2019-12-27 ENCOUNTER — Other Ambulatory Visit: Payer: Self-pay

## 2019-12-27 ENCOUNTER — Ambulatory Visit: Payer: Managed Care, Other (non HMO)

## 2019-12-27 DIAGNOSIS — R928 Other abnormal and inconclusive findings on diagnostic imaging of breast: Secondary | ICD-10-CM

## 2019-12-28 ENCOUNTER — Ambulatory Visit: Payer: Managed Care, Other (non HMO) | Admitting: Family Medicine

## 2020-02-14 ENCOUNTER — Other Ambulatory Visit: Payer: Self-pay | Admitting: Family Medicine

## 2020-05-13 ENCOUNTER — Other Ambulatory Visit: Payer: Self-pay | Admitting: Family Medicine

## 2020-09-13 ENCOUNTER — Other Ambulatory Visit: Payer: Self-pay | Admitting: Family Medicine

## 2020-12-11 ENCOUNTER — Other Ambulatory Visit: Payer: Self-pay | Admitting: Family Medicine

## 2020-12-11 NOTE — Telephone Encounter (Signed)
Left patient message regarding setting up virtual with Dr. Katrinka Blazing for med refill. Last seen 2020.

## 2021-03-19 IMAGING — MG MM DIGITAL DIAGNOSTIC UNILAT*R* W/ TOMO W/ CAD
4 series · 4 of 12 positions shown · non-contrast
Comparison: Previous exam(s).

CLINICAL DATA: 48-year-old female presenting as a recall from
screening for possible right breast asymmetry.

EXAM:
DIGITAL DIAGNOSTIC UNILATERAL RIGHT MAMMOGRAM WITH CAD AND TOMO

[R MLO synth-2D]
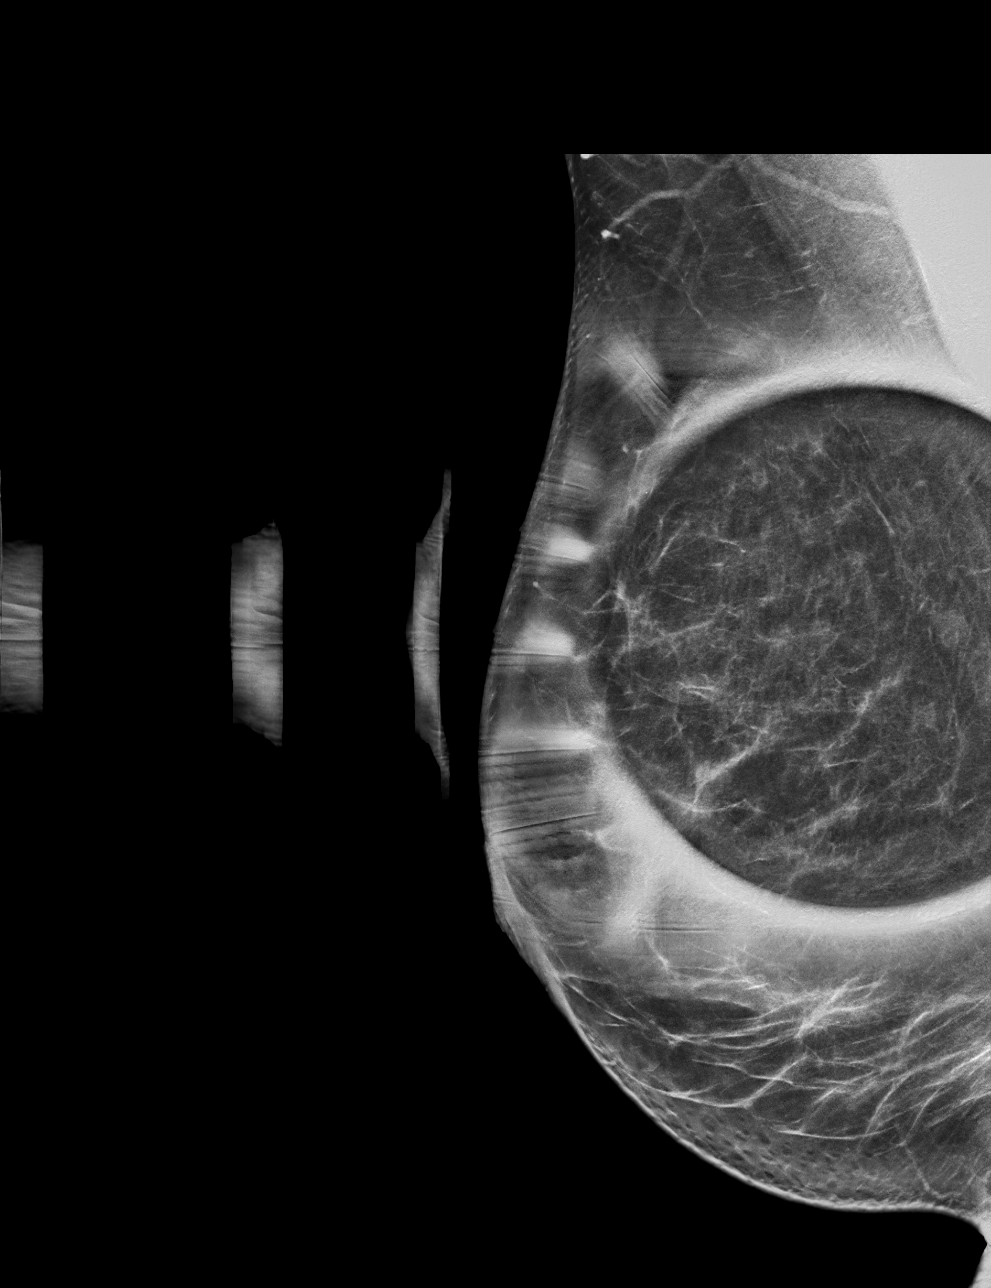

[R CC synth-2D]
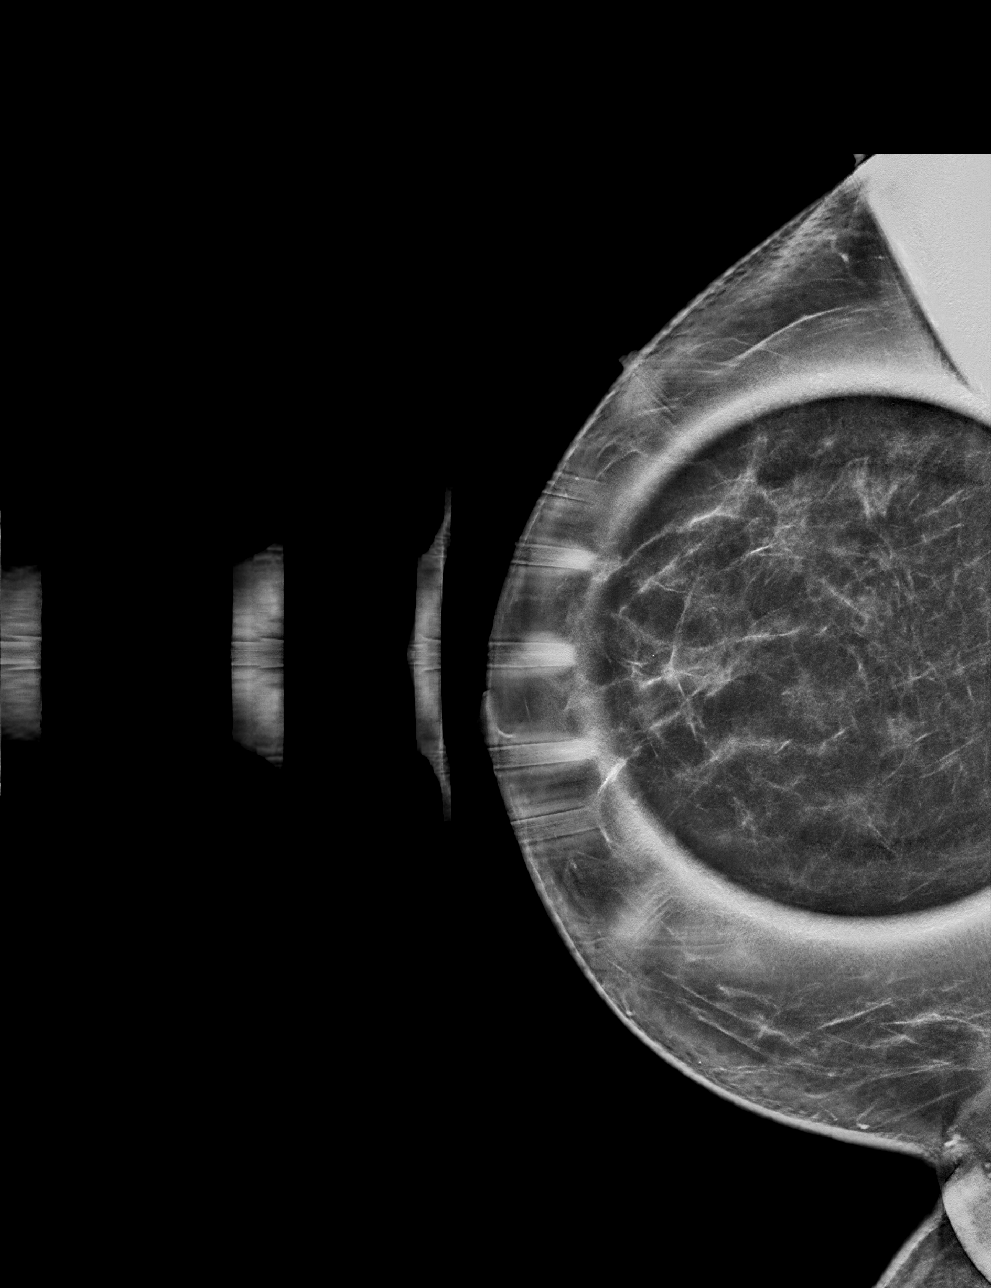

[R CC tomo · tomo slice 40/79.0]
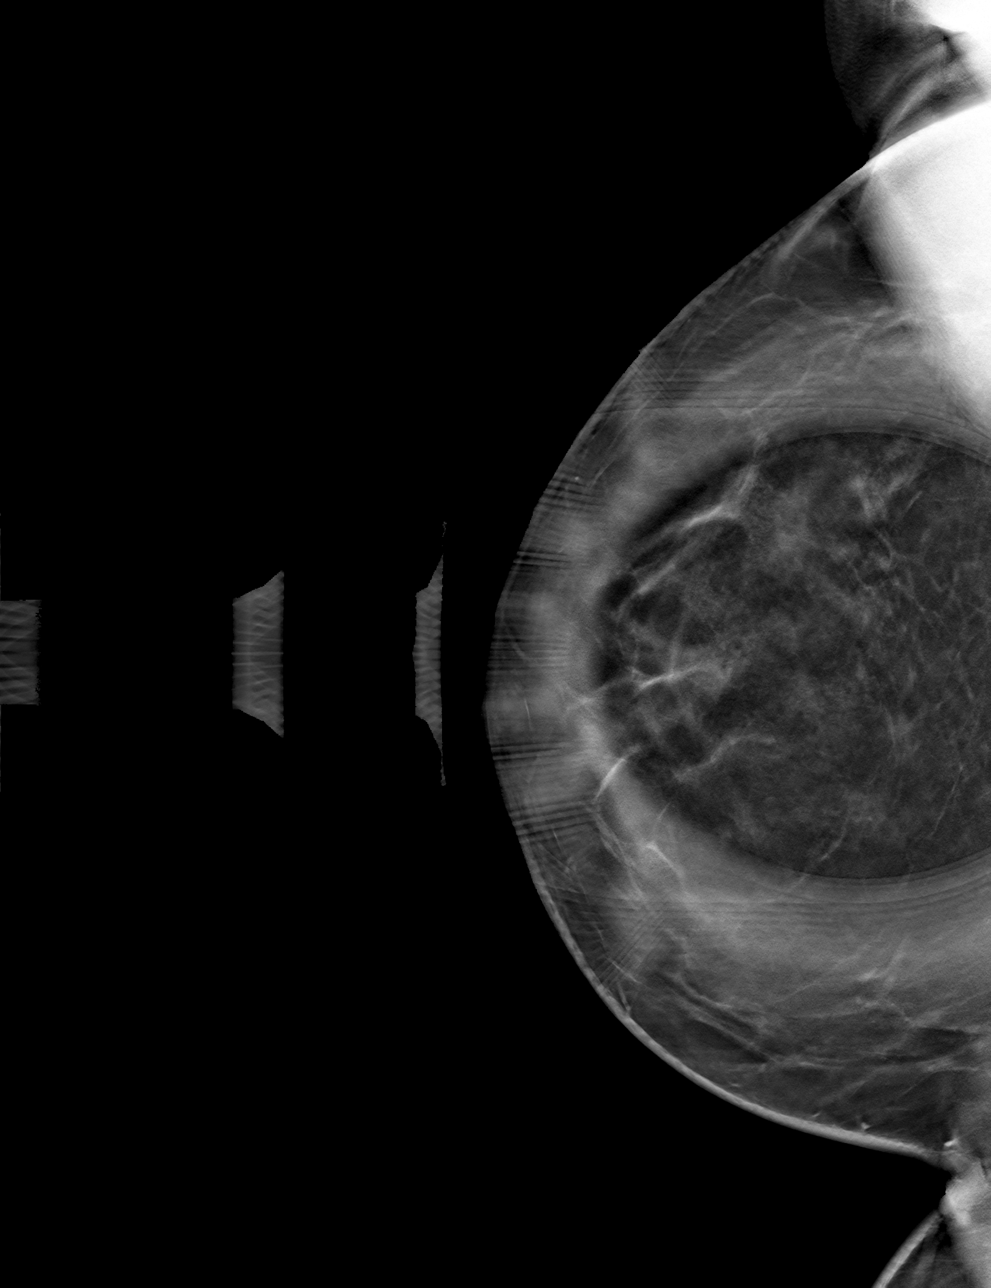

[R MLO tomo · tomo slice 39/78.0]
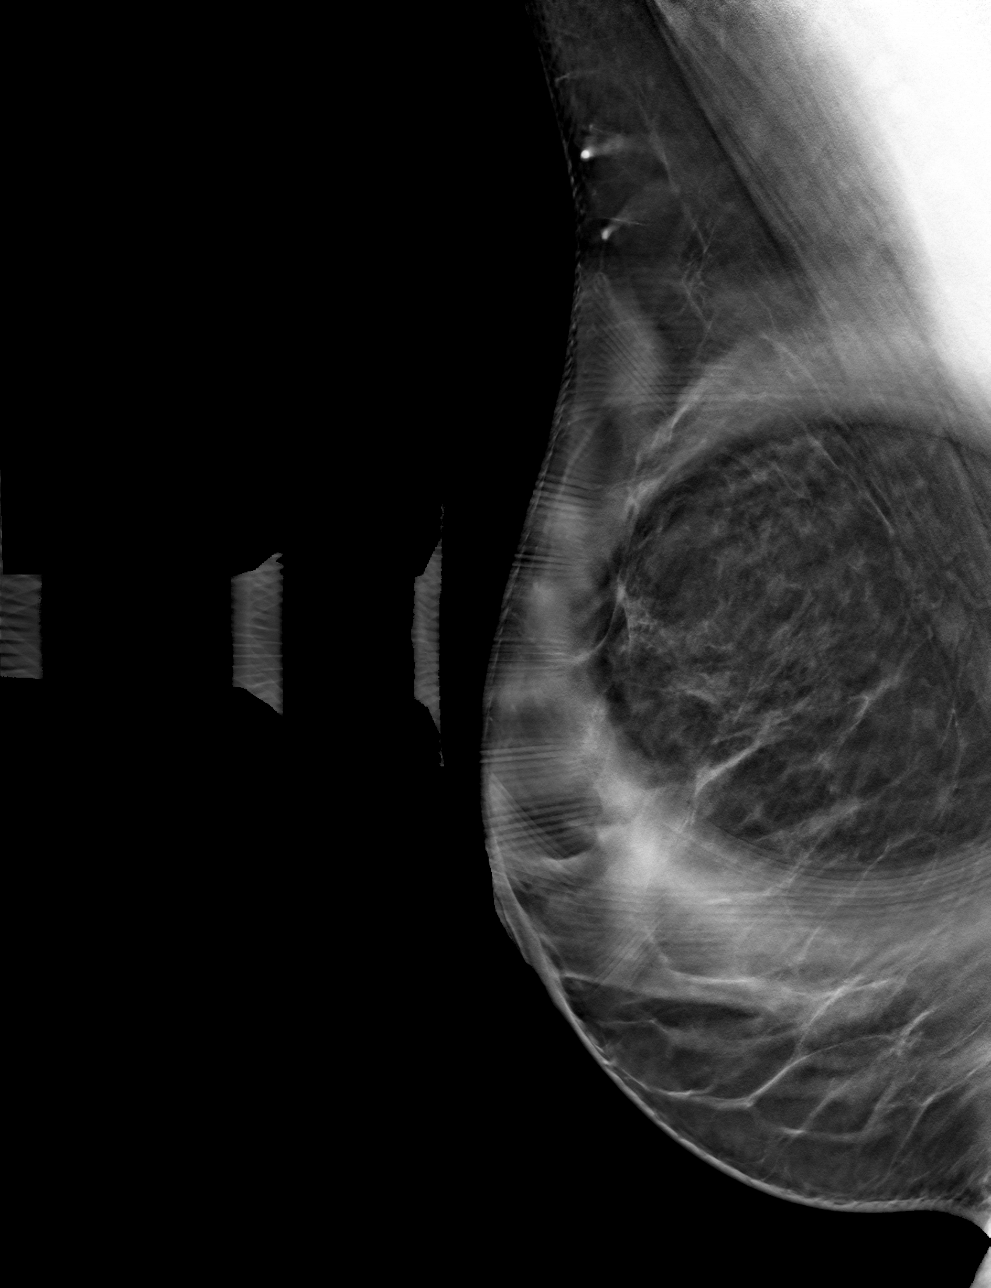

[4 of 12 positions shown; findings below may reference images not displayed]

ACR Breast Density Category b: There are scattered areas of
fibroglandular density.
FINDINGS: Spot compression tomosynthesis views were performed for the
questioned asymmetry in the central far posterior right breast. On
the additional imaging the tissue in this area disperses without
underlying mass or distortion, most consistent with normal
overlapping fibroglandular tissue.

Mammographic images were processed with CAD.
IMPRESSION: No mammographic evidence of malignancy in the right breast.

RECOMMENDATION:
Screening mammogram in one year.(Code:[id number]-[id number])

I have discussed the findings and recommendations with the patient.
If applicable, a reminder letter will be sent to the patient
regarding the next appointment.

BI-RADS CATEGORY  1: Negative.

## 2021-07-26 ENCOUNTER — Emergency Department (HOSPITAL_BASED_OUTPATIENT_CLINIC_OR_DEPARTMENT_OTHER)
Admission: EM | Admit: 2021-07-26 | Discharge: 2021-07-26 | Disposition: A | Discharge status: Home / Self Care | Payer: No Typology Code available for payment source | Attending: Emergency Medicine | Admitting: Emergency Medicine

## 2021-07-26 ENCOUNTER — Other Ambulatory Visit: Payer: Self-pay

## 2021-07-26 ENCOUNTER — Emergency Department (HOSPITAL_BASED_OUTPATIENT_CLINIC_OR_DEPARTMENT_OTHER): Payer: No Typology Code available for payment source

## 2021-07-26 ENCOUNTER — Encounter (HOSPITAL_BASED_OUTPATIENT_CLINIC_OR_DEPARTMENT_OTHER): Payer: Self-pay

## 2021-07-26 DIAGNOSIS — S93402A Sprain of unspecified ligament of left ankle, initial encounter: Secondary | ICD-10-CM | POA: Insufficient documentation

## 2021-07-26 DIAGNOSIS — W109XXA Fall (on) (from) unspecified stairs and steps, initial encounter: Secondary | ICD-10-CM | POA: Insufficient documentation

## 2021-07-26 DIAGNOSIS — W19XXXA Unspecified fall, initial encounter: Secondary | ICD-10-CM

## 2021-07-26 DIAGNOSIS — S8392XA Sprain of unspecified site of left knee, initial encounter: Secondary | ICD-10-CM | POA: Insufficient documentation

## 2021-07-26 DIAGNOSIS — S99912A Unspecified injury of left ankle, initial encounter: Secondary | ICD-10-CM | POA: Diagnosis present

## 2021-07-26 MED ORDER — OXYCODONE-ACETAMINOPHEN 5-325 MG PO TABS
1.0000 | ORAL_TABLET | Freq: Once | ORAL | Status: AC
  Administered 2021-07-26: 1 via ORAL
  Filled 2021-07-26: qty 1

## 2021-07-26 MED ORDER — OXYCODONE-ACETAMINOPHEN 5-325 MG PO TABS: 1.0000 | ORAL_TABLET | Freq: Four times a day (QID) | ORAL | 0 refills | Status: AC | PRN

## 2021-07-26 NOTE — ED Triage Notes (Signed)
Patient was stumbling and twisted her left ankle before falling to the ground, noticeable deformity of left ankle, pedal pulse present.

## 2021-07-26 NOTE — ED Provider Notes (Signed)
[facility] EMERGENCY DEPT Provider Note   CSN: [id number] Arrival date & time: 07/26/21  1828     History Chief Complaint  Patient presents with   Ankle Injury    Stephanie Phelps is a 50 y.o. female.  The history is provided by the patient.  Ankle Injury Pertinent negatives include no shortness of breath. Patient presents after left knee and ankle injury.  States she fell down a step or 2.  Felt immediately pulling in her left elbow.  Also some pain in her left knee.  Swelling in the ankle.  Did not hit head.  No other injury.  Not on anticoagulation.  No neck pain.  No numbness or weakness.     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 No pertinent surgical history.   OB History   No obstetric history on file.     Family History  Problem Relation Age of Onset   Allergies Mother    Fibroids Mother    Hearing loss Father    Hypertension Father    Diabetes Maternal Grandmother    Birth defects Paternal Grandmother        colon cancer   Hearing loss Paternal Grandfather    Stroke Paternal Grandfather     Social History   Tobacco Use   Smoking status: Never   Smokeless tobacco: Never  Vaping Use   Vaping Use: Never used  Substance Use Topics   Alcohol use: Yes   Drug use: No    Home Medications Prior to Admission medications   Medication Sig Start Date End Date Taking? Authorizing Provider  oxyCODONE-acetaminophen (PERCOCET/ROXICET) 5-325 MG tablet Take 1 tablet by mouth every 6 (six) hours as needed for severe pain. 07/26/21  Yes Benjiman Core, MD  Ascorbic Acid CRYS Take by mouth.    [provider]   Cholecalciferol (VITAMIN D3) 1000 units CHEW Chew by mouth.    [provider]  Diclofenac Sodium (PENNSAID) 2 % SOLN Place 1 application 2 (two) times daily onto the skin. 10/11/17   Andrena Mews, DO  gabapentin (NEURONTIN) 100 MG capsule Take 2 capsules (200 mg total) by mouth at bedtime. 09/18/19   Judi Saa, DO  ibuprofen (ADVIL,MOTRIN) 200 MG tablet Take 200 mg by mouth every 6 (six) hours as needed.    [provider]  levonorgestrel (MIRENA) 20 MCG/24HR IUD by Intrauterine route.    [provider]  meloxicam (MOBIC) 15 MG tablet TAKE 1 TABLET BY MOUTH EVERY DAY 10/12/19   Judi Saa, DO  Multiple Vitamin (MULTIVITAMIN) tablet Take 1 tablet by mouth daily.    [provider]  Vitamin D, Ergocalciferol, (DRISDOL) 1.25 MG (50000 UNIT) CAPS capsule TAKE 1 CAPSULE (50,000 UNITS TOTAL) BY MOUTH EVERY 7 (SEVEN) DAYS. 09/16/20   Judi Saa, DO  Vitamin D, Ergocalciferol, (DRISDOL) 1.25 MG (50000 UT) CAPS capsule Take 1 capsule (50,000 Units total) by mouth every 7 (seven) days. 09/18/19   Judi Saa, DO    Allergies    Patient has no known allergies.  Review of Systems   Review of Systems  Constitutional:  Negative for appetite change and  fever.  Respiratory:  Negative for shortness of breath.   Musculoskeletal:  Negative for back pain.       Left knee and left ankle pain.  Skin:  Negative for wound.  Neurological:  Negative for weakness.  Psychiatric/Behavioral:  Negative for confusion.    Physical Exam Updated Vital Signs BP (!) 120/108   Pulse 71   Temp 98.6 F (37 C) (Oral)   Resp 18   Ht 5\' 6"  (1.676 m)   Wt 81.6 kg   SpO2 100%   BMI 29.05 kg/m   Physical Exam Vitals and nursing note reviewed.  HENT:     Head: Atraumatic.  Eyes:     Pupils: Pupils are equal, round, and reactive to light.  Cardiovascular:     Rate and Rhythm: Normal rate.  Chest:     Chest wall: No tenderness.  Abdominal:      Tenderness: There is no abdominal tenderness.  Musculoskeletal:     Comments: Tenderness over left ankle both medially and laterally.  Right foot somewhat medially deviated.  Skin intact.  Swelling over left ankle particularly.  Mild tenderness on knee.  Good range of motion of knee.  Skin:    General: Skin is warm.     Capillary Refill: Capillary refill takes less than 2 seconds.     Findings: No erythema or rash.  Neurological:     Mental Status: She is alert and oriented to person, place, and time.  Psychiatric:        Mood and Affect: Mood normal.    ED Results / Procedures / Treatments   Labs (all labs ordered are listed, but only abnormal results are displayed) Labs Reviewed - No data to display  EKG None  Radiology DG Ankle Complete Left  Result Date: 07/26/2021 CLINICAL DATA:  Status post fall. EXAM: LEFT ANKLE COMPLETE - 3+ VIEW COMPARISON:  None. FINDINGS: There is no evidence of fracture, dislocation, or joint effusion. There is no evidence of arthropathy or other focal bone abnormality. Moderate severity anterior and lateral soft tissue swelling is seen. IMPRESSION: Moderate severity anterior and lateral soft tissue swelling without evidence of acute fracture or dislocation. Electronically Signed   By: 07/28/2021 M.D.   On: 07/26/2021 19:49   DG Knee Complete 4 Views Left  Result Date: 07/26/2021 CLINICAL DATA:  Status post fall. EXAM: LEFT KNEE - COMPLETE 4+ VIEW COMPARISON:  None. FINDINGS: No evidence of an acute fracture or dislocation. No evidence of arthropathy or other focal bone abnormality. A small joint effusion is noted. IMPRESSION: 1. No acute fracture or dislocation. 2. Small joint effusion. Electronically Signed   By: 07/28/2021 M.D.   On: 07/26/2021 19:50    Procedures Procedures   Medications Ordered in ED Medications - No data to display  ED Course  I have reviewed the triage vital signs and the nursing notes.  Pertinent labs & imaging  results that were available during my care of the patient were reviewed by me and considered in my medical decision making (see chart for details).    MDM Rules/Calculators/A&P                          Patient with fall.  Left knee and left ankle pain.  However x-rays reassuring.  He has small possible effusion on left knee but knee is stable.  Also tenderness over left ankle swelling but negative x-ray.  Will give cam walker and  crutches.  Pain management for home.  Follow-up with orthopedic surgery.  No fracture seen. Final Clinical Impression(s) / ED Diagnoses Final diagnoses:  Fall, initial encounter  Sprain of left ankle, unspecified ligament, initial encounter  Sprain of left knee, unspecified ligament, initial encounter    Rx / DC Orders ED Discharge Orders          Ordered    oxyCODONE-acetaminophen (PERCOCET/ROXICET) 5-325 MG tablet  Every 6 hours PRN        07/26/21 2018             Benjiman Core, MD 07/26/21 2018

## 2022-10-17 IMAGING — DX DG ANKLE COMPLETE 3+V*L*
1 series · 3 of 3 positions shown · non-contrast
Comparison: None.

CLINICAL DATA: Status post fall.

EXAM:
LEFT ANKLE COMPLETE - 3+ VIEW

[Series 1: ankle · 0.14mm/px · 3 of 3 slices shown]
[im 1/3]
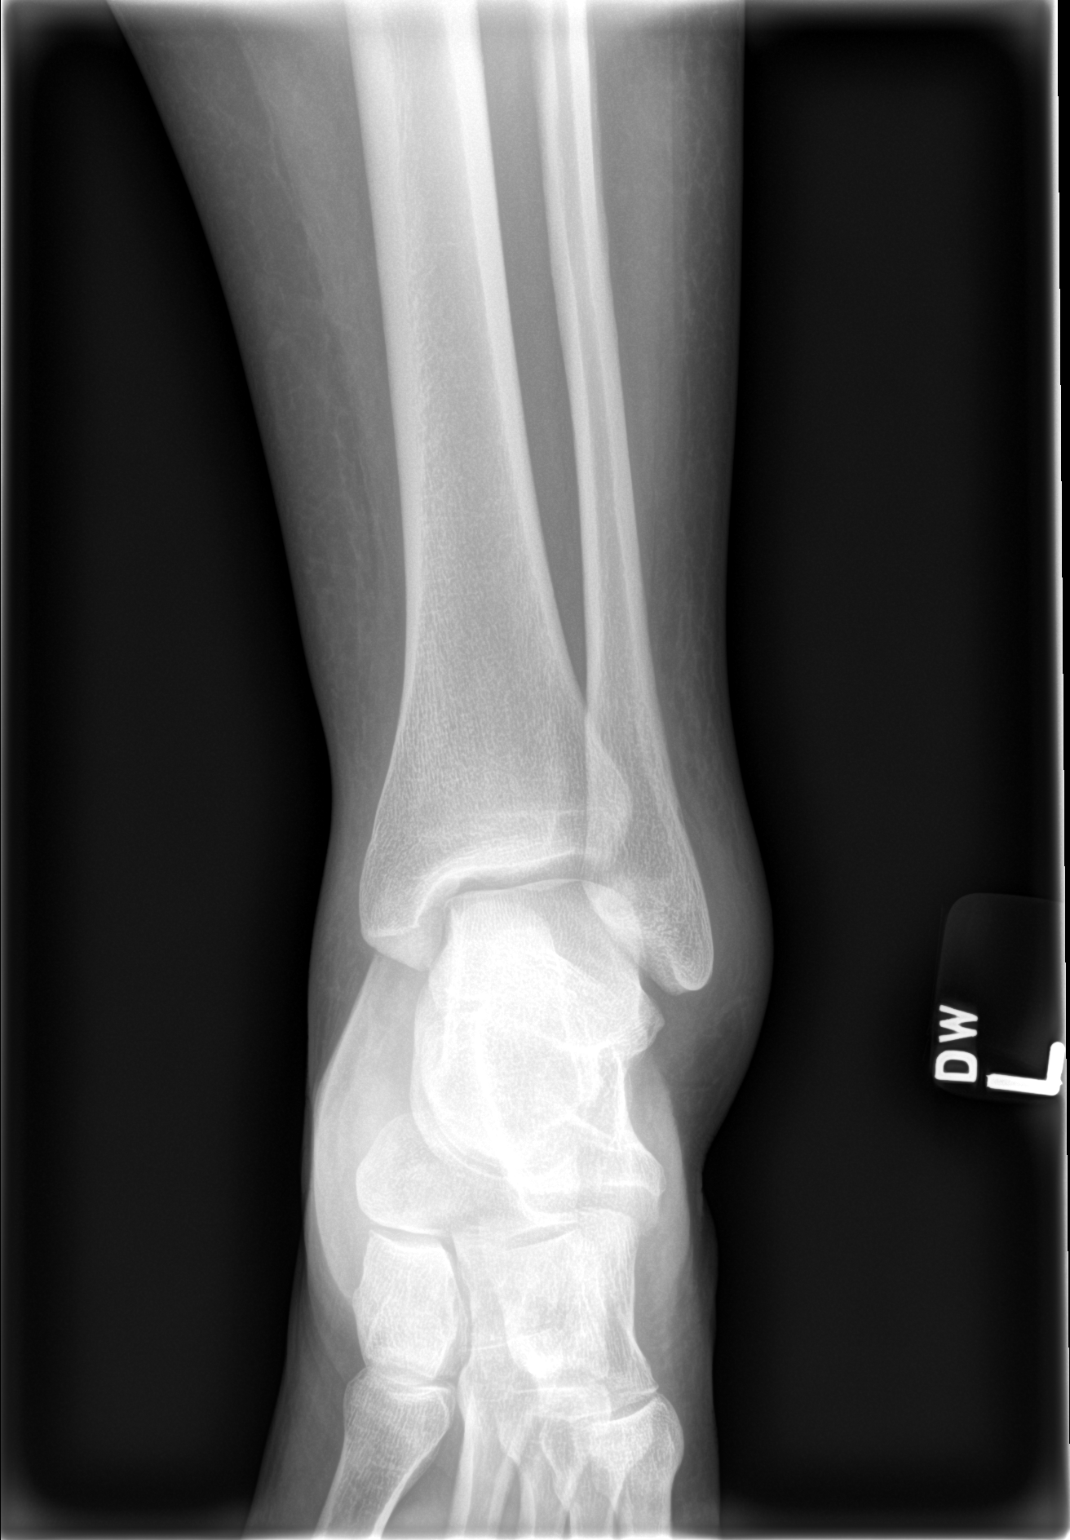
[im 2/3]
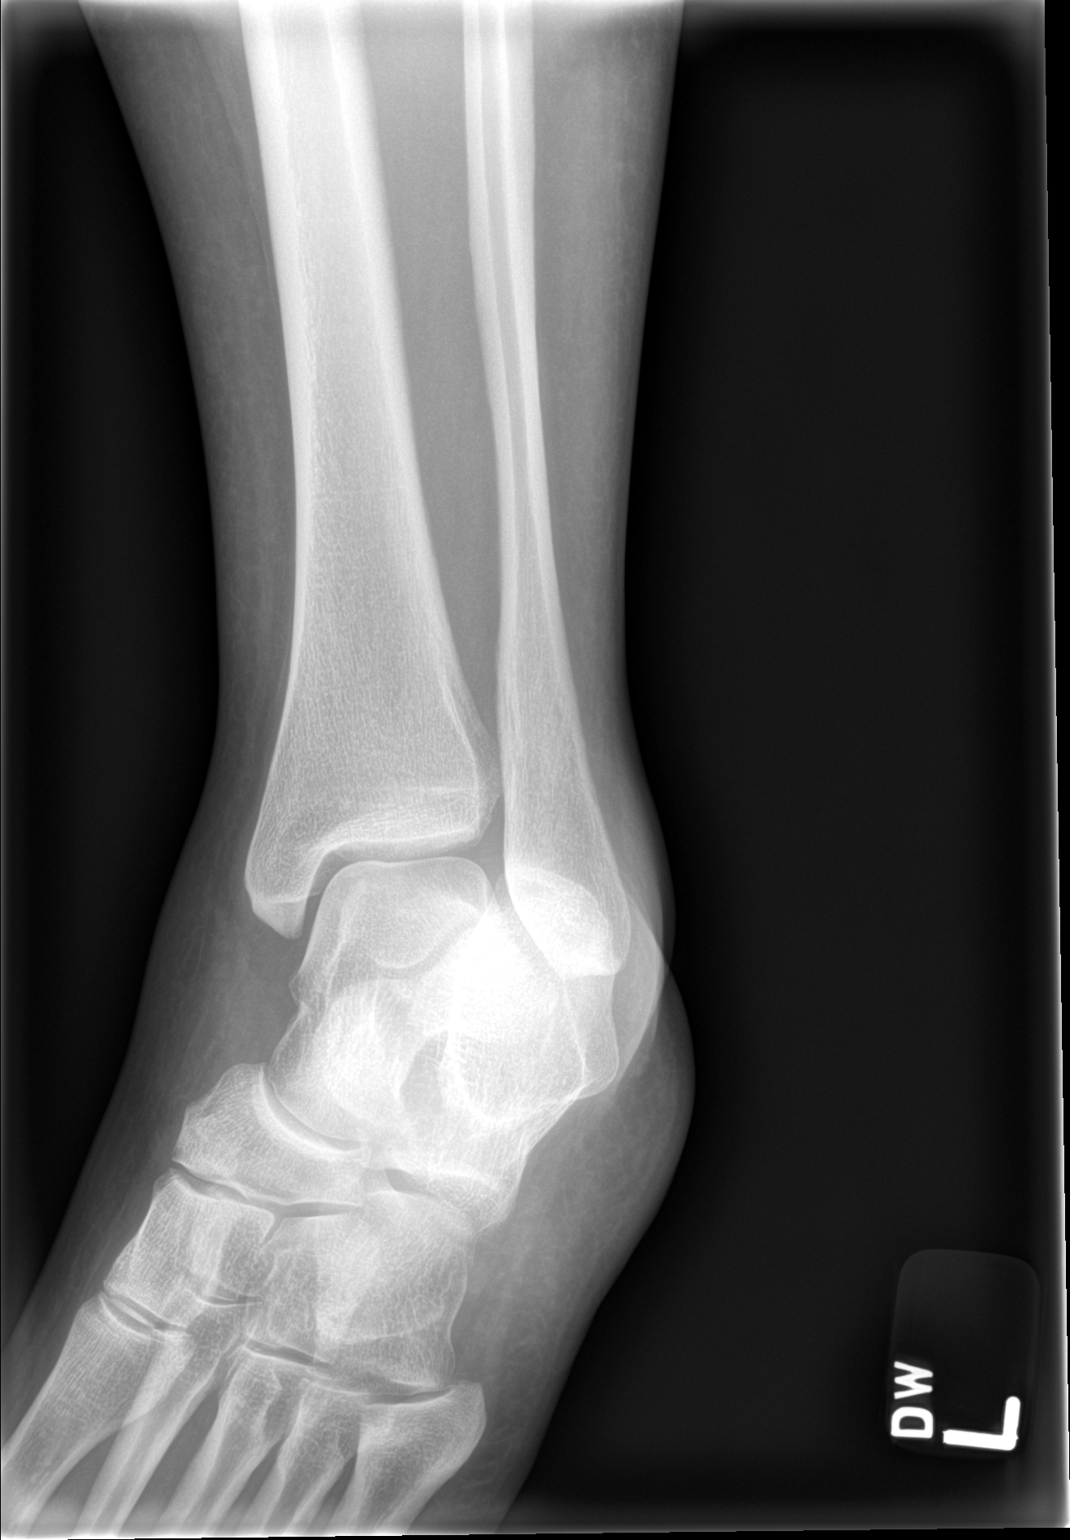
[im 3/3]
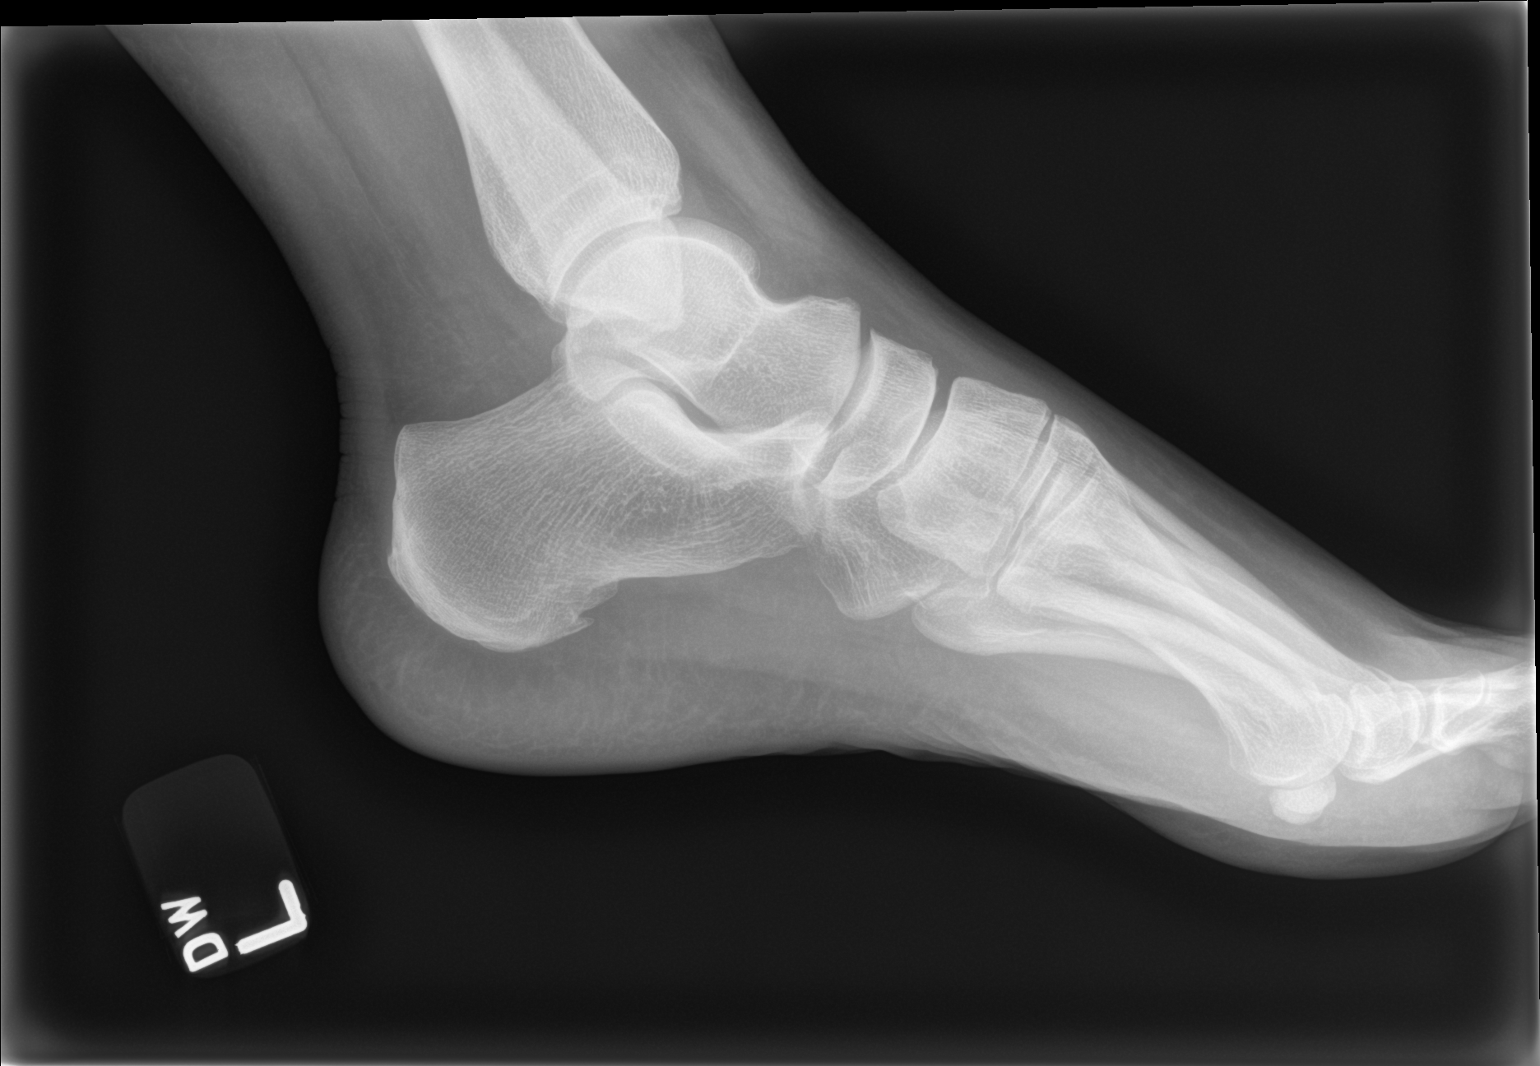

[3 of 3 positions shown; findings below may reference images not displayed]

FINDINGS: There is no evidence of fracture, dislocation, or joint effusion.
There is no evidence of arthropathy or other focal bone abnormality.
Moderate severity anterior and lateral soft tissue swelling is seen.
IMPRESSION: Moderate severity anterior and lateral soft tissue swelling without
evidence of acute fracture or dislocation.
